# Patient Record
Sex: Female | Born: 1975 | Race: White | Hispanic: No | Marital: Single | Smoking: Former smoker
Health system: Southern US, Community
[De-identification: ages and names within clinical notes are randomized; demographics above are authoritative.]

## PROBLEM LIST (undated history)

## (undated) DIAGNOSIS — K219 Gastro-esophageal reflux disease without esophagitis: Secondary | ICD-10-CM

## (undated) HISTORY — DX: Gastro-esophageal reflux disease without esophagitis: K21.9

---

## 2002-07-01 HISTORY — PX: CHOLECYSTECTOMY: SHX55

## 2009-07-01 HISTORY — PX: DILATION AND CURETTAGE OF UTERUS: SHX78

## 2011-12-03 ENCOUNTER — Ambulatory Visit (INDEPENDENT_AMBULATORY_CARE_PROVIDER_SITE_OTHER): Payer: 59 | Admitting: Internal Medicine

## 2011-12-03 ENCOUNTER — Ambulatory Visit (HOSPITAL_BASED_OUTPATIENT_CLINIC_OR_DEPARTMENT_OTHER)
Admission: RE | Admit: 2011-12-03 | Discharge: 2011-12-03 | Disposition: A | Discharge status: Home / Self Care | Payer: 59 | Source: Ambulatory Visit | Attending: Internal Medicine | Admitting: Internal Medicine

## 2011-12-03 ENCOUNTER — Encounter: Payer: Self-pay | Admitting: Internal Medicine

## 2011-12-03 VITALS — BP 118/70 | HR 77 | Temp 98.5°F | Ht 65.5 in | Wt 280.0 lb

## 2011-12-03 DIAGNOSIS — R059 Cough, unspecified: Secondary | ICD-10-CM

## 2011-12-03 DIAGNOSIS — R05 Cough: Secondary | ICD-10-CM | POA: Insufficient documentation

## 2011-12-03 DIAGNOSIS — K219 Gastro-esophageal reflux disease without esophagitis: Secondary | ICD-10-CM

## 2011-12-03 DIAGNOSIS — Z862 Personal history of diseases of the blood and blood-forming organs and certain disorders involving the immune mechanism: Secondary | ICD-10-CM

## 2011-12-03 DIAGNOSIS — N926 Irregular menstruation, unspecified: Secondary | ICD-10-CM

## 2011-12-03 LAB — CBC WITH DIFFERENTIAL/PLATELET
Basophils Absolute: 0.1 10*3/uL (ref 0.0–0.1)
Basophils Relative: 1 % (ref 0–1)
Eosinophils Absolute: 0.1 10*3/uL (ref 0.0–0.7)
Eosinophils Relative: 2 % (ref 0–5)
HCT: 34.3 % — ABNORMAL LOW (ref 36.0–46.0)
MCH: 25.4 pg — ABNORMAL LOW (ref 26.0–34.0)
MCHC: 31.8 g/dL (ref 30.0–36.0)
MCV: 80 fL (ref 78.0–100.0)
Monocytes Absolute: 0.3 10*3/uL (ref 0.1–1.0)
RDW: 16.1 % — ABNORMAL HIGH (ref 11.5–15.5)

## 2011-12-03 LAB — LIPID PANEL
HDL: 45 mg/dL (ref >39)
LDL Cholesterol: 93 mg/dL (ref 0–99)
Total CHOL/HDL Ratio: 3.5 Ratio
Triglycerides: 94 mg/dL (ref <150)

## 2011-12-03 LAB — COMPREHENSIVE METABOLIC PANEL
AST: 21 U/L (ref 0–37)
Alkaline Phosphatase: 51 U/L (ref 39–117)
BUN: 7 mg/dL (ref 6–23)
Creat: 0.83 mg/dL (ref 0.50–1.10)

## 2011-12-03 LAB — T3, FREE: T3, Free: 3.2 pg/mL (ref 2.3–4.2)

## 2011-12-03 NOTE — Patient Instructions (Signed)
Labs will be mailed to you   See me in 3 weeks  Chest xray today  Take acid reflux medicine twice a day

## 2011-12-04 ENCOUNTER — Encounter: Payer: Self-pay | Admitting: Internal Medicine

## 2011-12-04 DIAGNOSIS — R059 Cough, unspecified: Secondary | ICD-10-CM | POA: Insufficient documentation

## 2011-12-04 DIAGNOSIS — K219 Gastro-esophageal reflux disease without esophagitis: Secondary | ICD-10-CM | POA: Insufficient documentation

## 2011-12-04 DIAGNOSIS — Z862 Personal history of diseases of the blood and blood-forming organs and certain disorders involving the immune mechanism: Secondary | ICD-10-CM | POA: Insufficient documentation

## 2011-12-04 DIAGNOSIS — R05 Cough: Secondary | ICD-10-CM | POA: Insufficient documentation

## 2011-12-04 DIAGNOSIS — N926 Irregular menstruation, unspecified: Secondary | ICD-10-CM | POA: Insufficient documentation

## 2011-12-04 NOTE — Progress Notes (Signed)
Subjective:    Patient ID: Claudia Wang, female    DOB: 09-Sep-1975, 36 y.o.   MRN: [id number]  HPI New pt. Here to establish primary care.  GYN:  Dr. Truddie Crumble.  PMH of anemia that she was informed by Dr. Truddie Crumble, and GERD.  She is on Prilosec but she reports she does not take it daily  She is concerned over a persistant dry cough that wakes her at night and interrupts her sleep. Cough duration for several months now.  She notes she has severe heartburn at night described as a hot feeling backing up in her throat.   She coughs so hard that she states she vomits sometimes during the night.  At times she reports difficulty swallowing.  She denies bloody, dark or change in color of stool.  No hematemesis reported  She also notes heavy and irregular periods.  She reports Dr. Truddie Crumble did an D and C in 2011 and has done ultrasounds.  She has not been able to follow up with them regularly as she did not have insurance for a while.   Allergies  Allergen Reactions  . Vicodin (Hydrocodone-Acetaminophen) Other (See Comments)    Feel like stuff crawling all over me   Past Medical History  Diagnosis Date  . GERD (gastroesophageal reflux disease)    Past Surgical History  Procedure Date  . Cholecystectomy 2004  . Dilation and curettage of uterus 2011   History   Social History  . Marital Status: Single    Spouse Name: N/A    Number of Children: N/A  . Years of Education: N/A   Occupational History  . Not on file.   Social History Main Topics  . Smoking status: Former Games developer  . Smokeless tobacco: Never Used  . Alcohol Use: Yes     socially  . Drug Use: No  . Sexually Active: No   Other Topics Concern  . Not on file   Social History Narrative  . No narrative on file   Family History  Problem Relation Age of Onset  . Hypertension Mother   . Diabetes Father   . Hypertension Father    Patient Active Problem List  Diagnoses  . History of anemia  . GERD  (gastroesophageal reflux disease)  . Cough  . Morbid obesity   Current Outpatient Prescriptions on File Prior to Visit  Medication Sig Dispense Refill  . diphenhydrAMINE (BENADRYL) 25 mg capsule Take 25 mg by mouth every 6 (six) hours as needed.      Marland Kitchen omeprazole (PRILOSEC OTC) 20 MG tablet Take 20 mg by mouth daily as needed.            Review of Systems    see HPI Objective:   Physical Exam Physical Exam  Nursing note and vitals reviewed.  Constitutional: She is oriented to person, place, and time. She appears well-developed and well-nourished.  HENT:  Head: Normocephalic and atraumatic.  Cardiovascular: Normal rate and regular rhythm. Exam reveals no gallop and no friction rub.  No murmur heard.  Pulmonary/Chest: Breath sounds normal. She has no wheezes. She has no rales.  ABd:  Obese, soft Nt ND no HSM  No pain to palpation Neurological: She is alert and oriented to person, place, and time.  Skin: Skin is warm and dry.  Psychiatric: She has a normal mood and affect. Her behavior is normal.        Assessment & Plan:  1)  Dyspepsia/dysphagia  Will maximize her PPI therapy.  As prilosec is expensive for her will advise to take Prevacid 20 mg bid morning and night.  Will need EGD at some point due to dysphagia  2)  Persistant cough:  May be due to aspiration or possibly bronchospam due to allergies.  Will try PPI therapy for now,  Get CXR and see me for follow up in 2-3 weeks  Irreguar menses dysmennorrhea  Advised to see her GYN for this  History of anemia  Will check CBC   See me in 2-3 weeks or sooner prn

## 2011-12-05 ENCOUNTER — Telehealth: Payer: Self-pay | Admitting: Internal Medicine

## 2011-12-05 ENCOUNTER — Telehealth: Payer: Self-pay | Admitting: *Deleted

## 2011-12-05 MED ORDER — IRON 325 (65 FE) MG PO TABS: 1.0000 | ORAL_TABLET | Freq: Every day | ORAL | Status: AC

## 2011-12-05 NOTE — Telephone Encounter (Signed)
Sent copy of labs to pt's home address.

## 2011-12-05 NOTE — Telephone Encounter (Signed)
LM on cell voice mail that her CXR was clear but did show a small hiatal hernia.  Per Dr Constance Goltz she will discuss in further detail @ pt's f/u visit.

## 2011-12-05 NOTE — Telephone Encounter (Signed)
Spoke with pt and informed of anemia.  She has been told in the past by Dr. Ria Bush and anemia felt due to heavy menses.  Will e-scribe daily Iron.  She voices understanding

## 2011-12-24 ENCOUNTER — Telehealth: Payer: Self-pay | Admitting: Internal Medicine

## 2011-12-24 ENCOUNTER — Ambulatory Visit: Payer: 59 | Admitting: Internal Medicine

## 2012-01-30 ENCOUNTER — Other Ambulatory Visit (HOSPITAL_COMMUNITY)
Admission: RE | Admit: 2012-01-30 | Discharge: 2012-01-30 | Disposition: A | Discharge status: Home / Self Care | Payer: 59 | Source: Ambulatory Visit | Attending: Obstetrics & Gynecology | Admitting: Obstetrics & Gynecology

## 2012-01-30 DIAGNOSIS — Z124 Encounter for screening for malignant neoplasm of cervix: Secondary | ICD-10-CM | POA: Insufficient documentation

## 2012-01-30 DIAGNOSIS — Z1151 Encounter for screening for human papillomavirus (HPV): Secondary | ICD-10-CM | POA: Insufficient documentation

## 2012-01-30 DIAGNOSIS — N920 Excessive and frequent menstruation with regular cycle: Secondary | ICD-10-CM | POA: Insufficient documentation

## 2012-02-03 NOTE — Progress Notes (Signed)
  Subjective:    Patient ID: Claudia Wang, female    DOB: 1976-06-28, 36 y.o.   MRN: [id number]  HPI No show   Review of Systems     Objective:   Physical Exam        Assessment & Plan:

## 2013-11-06 ENCOUNTER — Encounter (HOSPITAL_BASED_OUTPATIENT_CLINIC_OR_DEPARTMENT_OTHER): Payer: Self-pay | Admitting: Emergency Medicine

## 2013-11-06 ENCOUNTER — Emergency Department (HOSPITAL_BASED_OUTPATIENT_CLINIC_OR_DEPARTMENT_OTHER)
Admission: EM | Admit: 2013-11-06 | Discharge: 2013-11-06 | Disposition: A | Discharge status: Home / Self Care | Payer: 59 | Attending: Emergency Medicine | Admitting: Emergency Medicine

## 2013-11-06 DIAGNOSIS — Z79899 Other long term (current) drug therapy: Secondary | ICD-10-CM | POA: Insufficient documentation

## 2013-11-06 DIAGNOSIS — Z87891 Personal history of nicotine dependence: Secondary | ICD-10-CM | POA: Insufficient documentation

## 2013-11-06 DIAGNOSIS — K219 Gastro-esophageal reflux disease without esophagitis: Secondary | ICD-10-CM | POA: Insufficient documentation

## 2013-11-06 DIAGNOSIS — B349 Viral infection, unspecified: Secondary | ICD-10-CM

## 2013-11-06 DIAGNOSIS — B9789 Other viral agents as the cause of diseases classified elsewhere: Secondary | ICD-10-CM | POA: Insufficient documentation

## 2013-11-06 NOTE — ED Notes (Signed)
C/O headache, body aches, congestion since Tuesday- nausea x 1 day with no vomiting

## 2013-11-06 NOTE — ED Provider Notes (Signed)
CSN: [id number]     Arrival date & time 11/06/13  1730 History  This chart was scribed for Charles B. Bernette MayersSheldon, MD by Danella Maiersaroline Early, ED Scribe. This patient was seen in room MH06/MH06 and the patient's care was started at 6:10 PM.    Chief Complaint  Patient presents with  . Generalized Body Aches   The history is provided by the patient. No language interpreter was used.   HPI Comments: Ezra SitesJennifer Clune is a 38 y.o. female who presents to the Emergency Department complaining of constant, gradual-onset, gradually-worsening sore throat with associated body aches, headache, dry cough, and nasal congestion onset 4 days ago. She has been taking Benadryl and ibuprofen with little to no relief. She reports nausea but no vomiting. She reports lack of appetite. She denies fever. She has no chronic medical problems. 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   Past Medical History  Diagnosis Date  . GERD (gastroesophageal reflux disease)    Past Surgical History  Procedure Laterality Date  . Cholecystectomy  2004  . Dilation and curettage of uterus  2011   Family History  Problem Relation Age of Onset  . Hypertension Mother   . Diabetes Father   . Hypertension Father    History  Substance Use Topics  . Smoking status: Former Games developermoker  . Smokeless tobacco: Never Used  . Alcohol Use: Yes     Comment: socially   OB History   Grav Para Term Preterm Abortions TAB SAB Ect Mult Living   1 1 1             Review of Systems  Constitutional: Positive for appetite change. Negative for fever.  HENT: Positive for congestion and sore throat.   Respiratory: Positive for cough.   Gastrointestinal: Positive for nausea. Negative for vomiting.  Musculoskeletal: Positive for myalgias.  Neurological: Positive for headaches.   A complete 10 system review of systems was obtained and all systems are negative except as noted in  the HPI and PMH.     Allergies  Vicodin  Home Medications   Prior to Admission medications   Medication Sig Start Date End Date Taking? Authorizing Provider  aspirin-acetaminophen-caffeine (EXCEDRIN MIGRAINE) 6237184481250-250-65 MG per tablet Take 2 tablets by mouth every 6 (six) hours as needed.   Yes Historical Provider, MD  diphenhydrAMINE (BENADRYL) 25 mg capsule Take 25 mg by mouth every 6 (six) hours as needed.   Yes Historical Provider, MD  Ibuprofen (MIDOL) 200 MG CAPS Take 2 capsules by mouth every 6 (six) hours as needed.   Yes Historical Provider, MD  Ferrous Sulfate (IRON) 325 (65 FE) MG TABS Take 1 tablet by mouth daily. 12/05/11   Kendrick Rancheborah D Schoenhoff, MD  omeprazole (PRILOSEC OTC) 20 MG tablet Take 20 mg by mouth daily as needed.    Historical Provider, MD   BP 155/87  Pulse 86  Temp(Src) 98.1 F (36.7 C) (Oral)  Resp 20  Ht 5\' 6"  (1.676 m)  Wt 260 lb (117.935 kg)  BMI 41.99 kg/m2  SpO2 99%  LMP 10/24/2013 Physical Exam  Nursing note and vitals reviewed. Constitutional: She is oriented to person, place, and time. She appears well-developed and well-nourished.  HENT:  Head: Normocephalic and atraumatic.  Eyes: EOM are normal. Pupils are equal, round, and reactive to light.  Neck: Normal range of motion. Neck supple.  Cardiovascular: Normal rate, normal heart sounds and intact  distal pulses.   Pulmonary/Chest: Effort normal and breath sounds normal.  Abdominal: Bowel sounds are normal. She exhibits no distension. There is no tenderness.  Musculoskeletal: Normal range of motion. She exhibits no edema and no tenderness.  Neurological: She is alert and oriented to person, place, and time. She has normal strength. No cranial nerve deficit or sensory deficit.  Skin: Skin is warm and dry. No rash noted.  Psychiatric: She has a normal mood and affect.    ED Course  Procedures (including critical care time) Medications - No data to display  DIAGNOSTIC STUDIES: Oxygen  Saturation is 99% on RA, normal by my interpretation.    COORDINATION OF CARE: 6:18 PM- Discussed treatment plan with pt which includes discharge home. Pt agrees to plan.    Labs Review Labs Reviewed - No data to display  Imaging Review No results found.   EKG Interpretation None      MDM   Final diagnoses:  Viral syndrome   Pt with viral syndrome. Advised rest, fluids, OTC meds.   I personally performed the services described in this documentation, which was scribed in my presence. The recorded information has been reviewed and is accurate.      Charles B. Bernette MayersSheldon, MD 11/06/13 [id number]

## 2013-11-06 NOTE — Discharge Instructions (Signed)

## 2013-12-01 IMAGING — CR DG CHEST 2V
2 series · 2 of 2 positions shown · non-contrast
Comparison: None.

CLINICAL DATA: Cough for several months.

CHEST - 2 VIEW

[w chest pa]
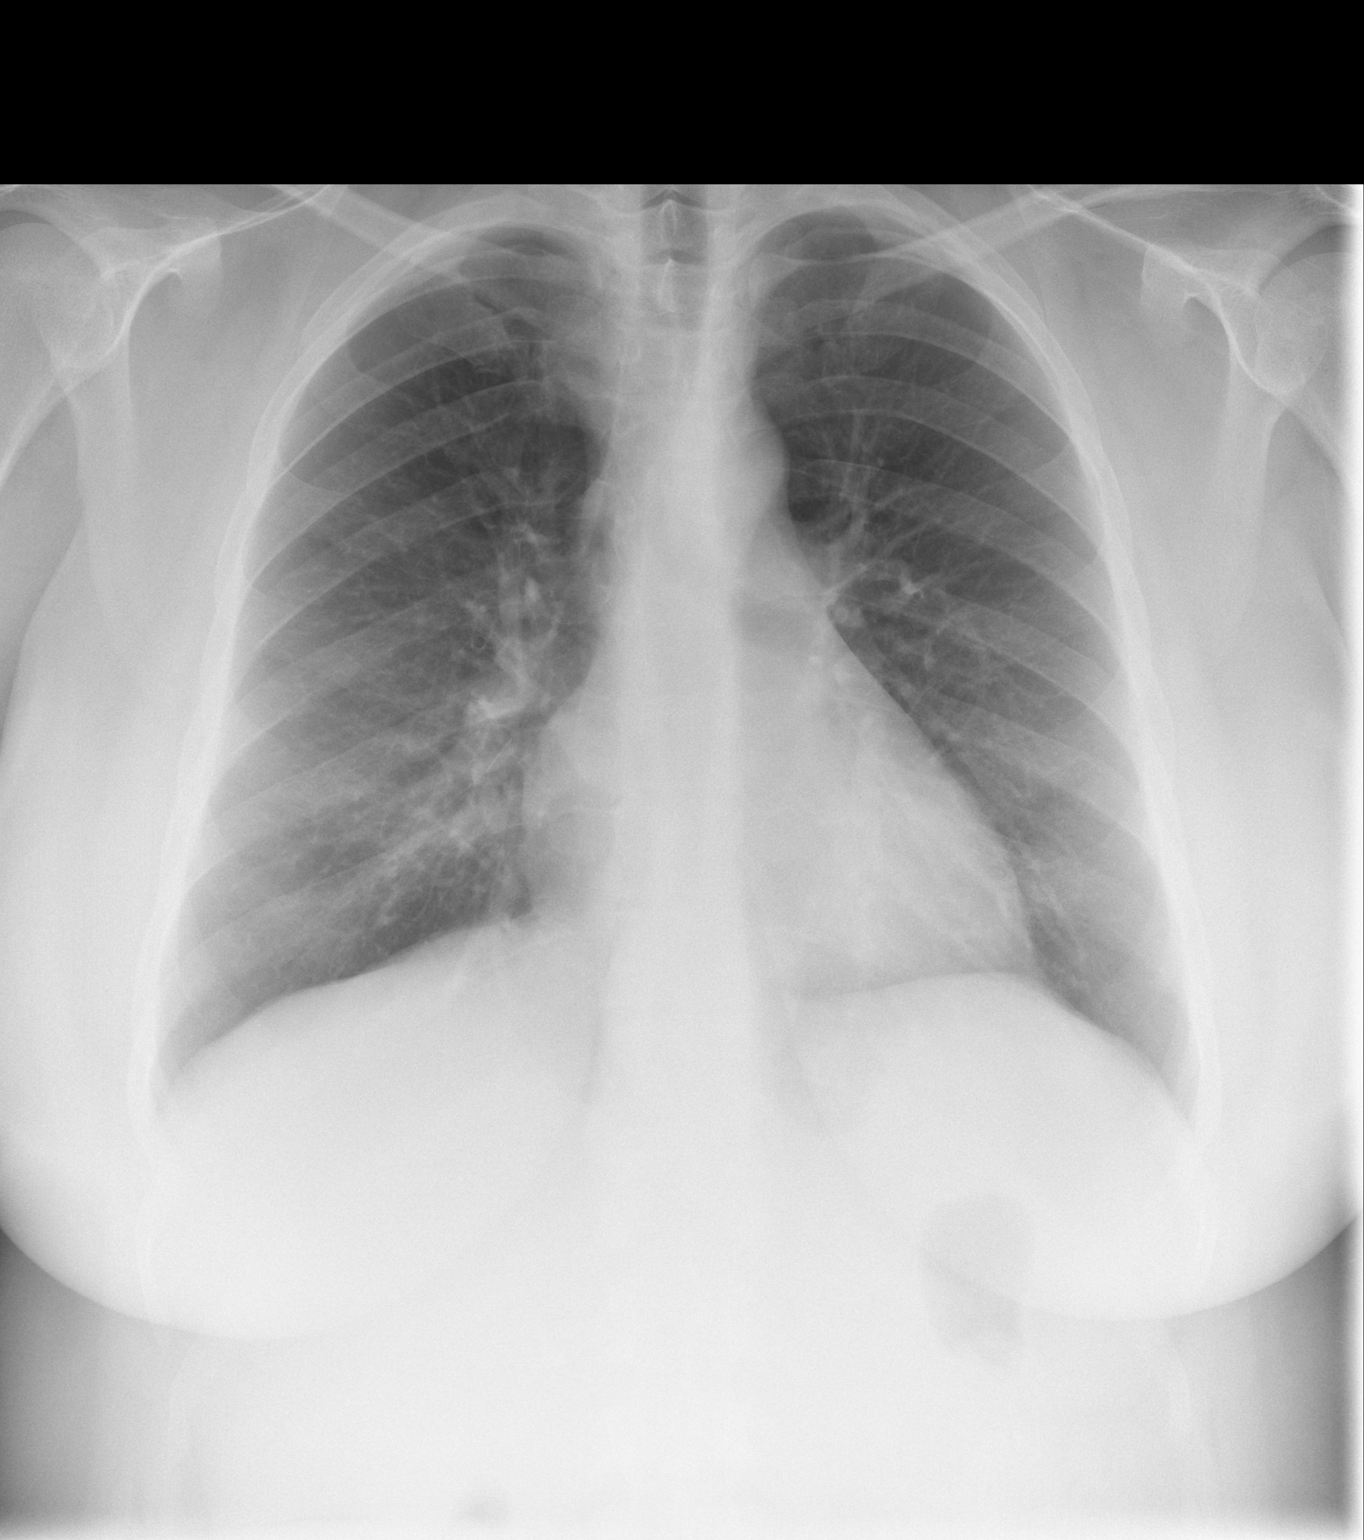

[w chest lat]
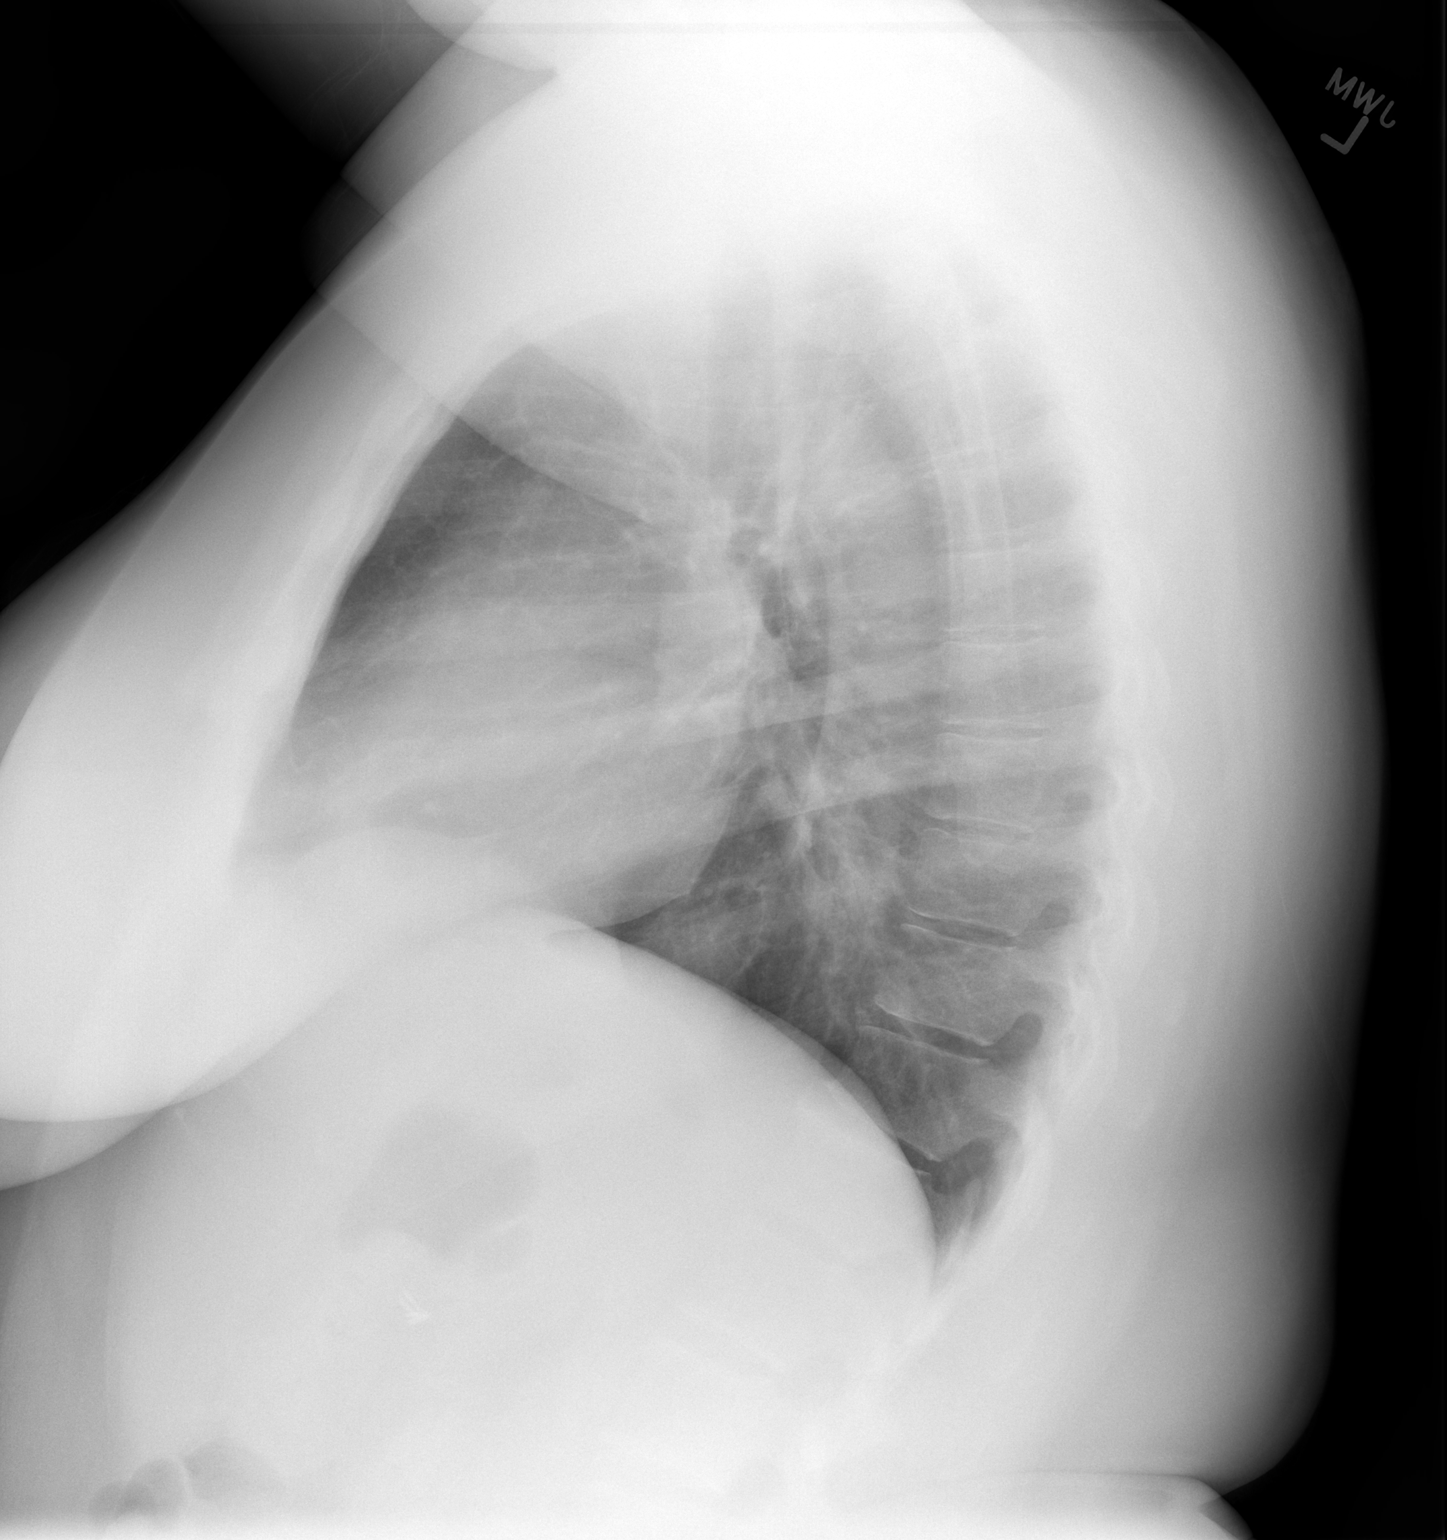

[2 of 2 positions shown; findings below may reference images not displayed]

FINDINGS: Cardiopericardial silhouette and hilar contours are
normal.  Question a small hiatal hernia contour.  The pulmonary
vascularity is normal and the lungs are clear.  There is no pleural
effusion.  No acute bony abnormality.  Cholecystectomy clips noted.
IMPRESSION: 1.  Question small hiatal hernia.
2.  No acute cardiopulmonary disease.

## 2014-05-02 ENCOUNTER — Encounter (HOSPITAL_BASED_OUTPATIENT_CLINIC_OR_DEPARTMENT_OTHER): Payer: Self-pay | Admitting: Emergency Medicine
